# Patient Record
Sex: Male | Born: 2000 | Race: Black or African American | Hispanic: No | Marital: Single | Smoking: Never smoker
Health system: Southern US, Community
[De-identification: ages and names within clinical notes are randomized; demographics above are authoritative.]

## PROBLEM LIST (undated history)

## (undated) DIAGNOSIS — W3400XA Accidental discharge from unspecified firearms or gun, initial encounter: Secondary | ICD-10-CM

---
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
## 2019-07-30 ENCOUNTER — Ambulatory Visit: Payer: Self-pay | Admitting: Family Medicine

## 2019-07-30 ENCOUNTER — Other Ambulatory Visit: Payer: Self-pay

## 2019-07-30 ENCOUNTER — Encounter: Payer: Self-pay | Admitting: Family Medicine

## 2019-07-30 DIAGNOSIS — Z113 Encounter for screening for infections with a predominantly sexual mode of transmission: Secondary | ICD-10-CM

## 2019-07-30 DIAGNOSIS — Z202 Contact with and (suspected) exposure to infections with a predominantly sexual mode of transmission: Secondary | ICD-10-CM

## 2019-07-30 LAB — GRAM STAIN

## 2019-07-30 MED ORDER — AZITHROMYCIN 500 MG PO TABS
1000.0000 mg | ORAL_TABLET | Freq: Once | ORAL | Status: AC
  Administered 2019-07-30: 15:00:00 1000 mg via ORAL

## 2019-07-30 MED ORDER — AZITHROMYCIN 500 MG PO TABS: 500.0000 mg | ORAL_TABLET | Freq: Once | ORAL | Status: DC

## 2019-07-30 NOTE — Progress Notes (Signed)
Reviewed orders and Gram stain results.  Patient treated with Azithromycin 1 g po DOT today.  Reviewed with patient possible SE and when to call if vomits < 2 hr after taking medicine.

## 2019-07-30 NOTE — Progress Notes (Signed)
    STI clinic/screening visit  Subjective:  Joel Soto is a 18 y.o. male being seen today for an STI screening visit. The patient reports they do not have symptoms.  Patient has the following medical conditions:  There are no active problems to display for this patient.    Chief Complaint  Patient presents with  . Exposure to STD    HPI  Patient reports no sympts. here for treatment of chlamyida  See flowsheet for further details and programmatic requirements.    The following portions of the patient's history were reviewed and updated as appropriate: allergies, current medications, past medical history, past social history, past surgical history and problem list.  Objective:  There were no vitals filed for this visit.  Physical Exam HENT:     Mouth/Throat:     Pharynx: Oropharynx is clear.  Neck:     Musculoskeletal: No muscular tenderness.  Abdominal:     Palpations: Abdomen is soft.     Tenderness: There is no abdominal tenderness.  Genitourinary:    Penis: Normal.      Scrotum/Testes: Normal.  Lymphadenopathy:     Cervical: No cervical adenopathy.  Skin:    General: Skin is warm.     Findings: No lesion or rash.  Neurological:     Mental Status: He is alert.    Assessment and Plan:  Joel Soto is a 18 y.o. male presenting to the [facility] Department for STI screening  1. Screening examination for venereal disease  - Gram stain - Gonococcus culture - HIV [place] [facility] - Syphilis Serology, [facility]  2. Exposure to sexually transmitted disease (STD)  - Azithromycin (ZITHROMAX) tablet  1000 mg po once -Co. To contact partners for treatment and to use condoms for STD prevention     No follow-ups on file.  No future appointments.  Hassell Done, FNP

## 2019-07-30 NOTE — Progress Notes (Signed)
Patient here for STD testing. Partner diagnosed with Chlamydia, needs treatment for Chlamydia and desires all testing.Jenetta Downer, RN

## 2019-08-04 LAB — GONOCOCCUS CULTURE

## 2019-11-01 ENCOUNTER — Emergency Department: Payer: Medicaid Other

## 2019-11-01 ENCOUNTER — Emergency Department
Admission: EM | Admit: 2019-11-01 | Discharge: 2019-11-01 | Disposition: A | Discharge status: Home / Self Care | Payer: Medicaid Other | Attending: Emergency Medicine | Admitting: Emergency Medicine

## 2019-11-01 ENCOUNTER — Other Ambulatory Visit: Payer: Self-pay

## 2019-11-01 DIAGNOSIS — Z23 Encounter for immunization: Secondary | ICD-10-CM | POA: Insufficient documentation

## 2019-11-01 DIAGNOSIS — Y999 Unspecified external cause status: Secondary | ICD-10-CM | POA: Insufficient documentation

## 2019-11-01 DIAGNOSIS — W3400XA Accidental discharge from unspecified firearms or gun, initial encounter: Secondary | ICD-10-CM

## 2019-11-01 DIAGNOSIS — W3302XA Accidental discharge of hunting rifle, initial encounter: Secondary | ICD-10-CM | POA: Insufficient documentation

## 2019-11-01 DIAGNOSIS — Y9389 Activity, other specified: Secondary | ICD-10-CM | POA: Insufficient documentation

## 2019-11-01 DIAGNOSIS — Y929 Unspecified place or not applicable: Secondary | ICD-10-CM | POA: Insufficient documentation

## 2019-11-01 DIAGNOSIS — S91332A Puncture wound without foreign body, left foot, initial encounter: Secondary | ICD-10-CM | POA: Insufficient documentation

## 2019-11-01 HISTORY — DX: Accidental discharge from unspecified firearms or gun, initial encounter: W34.00XA

## 2019-11-01 LAB — BASIC METABOLIC PANEL
Anion gap: 11 (ref 5–15)
BUN: 13 mg/dL (ref 6–20)
CO2: 27 mmol/L (ref 22–32)
Calcium: 9.2 mg/dL (ref 8.9–10.3)
Chloride: 105 mmol/L (ref 98–111)
Creatinine, Ser: 1.07 mg/dL (ref 0.61–1.24)
GFR calc Af Amer: >60 mL/min (ref >60)
GFR calc non Af Amer: >60 mL/min (ref >60)
Glucose, Bld: 115 mg/dL — ABNORMAL HIGH (ref 70–99)
Potassium: 3.2 mmol/L — ABNORMAL LOW (ref 3.5–5.1)
Sodium: 143 mmol/L (ref 135–145)

## 2019-11-01 LAB — CBC
HCT: 45.8 % (ref 39.0–52.0)
Hemoglobin: 15.2 g/dL (ref 13.0–17.0)
MCH: 28.7 pg (ref 26.0–34.0)
MCHC: 33.2 g/dL (ref 30.0–36.0)
MCV: 86.6 fL (ref 80.0–100.0)
Platelets: 221 10*3/uL (ref 150–400)
RBC: 5.29 MIL/uL (ref 4.22–5.81)
RDW: 11.7 % (ref 11.5–15.5)
WBC: 13.1 10*3/uL — ABNORMAL HIGH (ref 4.0–10.5)
nRBC: 0 % (ref 0.0–0.2)

## 2019-11-01 MED ORDER — ONDANSETRON HCL 4 MG/2ML IJ SOLN
4.0000 mg | Freq: Once | INTRAMUSCULAR | Status: AC
  Administered 2019-11-01: 16:00:00 4 mg via INTRAVENOUS

## 2019-11-01 MED ORDER — MORPHINE SULFATE (PF) 4 MG/ML IV SOLN
4.0000 mg | Freq: Once | INTRAVENOUS | Status: AC
  Administered 2019-11-01: 16:00:00 4 mg via INTRAVENOUS

## 2019-11-01 MED ORDER — MORPHINE SULFATE (PF) 4 MG/ML IV SOLN
INTRAVENOUS | Status: AC
  Filled 2019-11-01: qty 1

## 2019-11-01 MED ORDER — OXYCODONE-ACETAMINOPHEN 5-325 MG PO TABS: 1.0000 | ORAL_TABLET | ORAL | 0 refills | Status: AC | PRN

## 2019-11-01 MED ORDER — TETANUS-DIPHTH-ACELL PERTUSSIS 5-2.5-18.5 LF-MCG/0.5 IM SUSP
0.5000 mL | Freq: Once | INTRAMUSCULAR | Status: AC
  Administered 2019-11-01: 18:00:00 0.5 mL via INTRAMUSCULAR
  Filled 2019-11-01: qty 0.5

## 2019-11-01 MED ORDER — ONDANSETRON HCL 4 MG/2ML IJ SOLN
INTRAMUSCULAR | Status: AC
  Filled 2019-11-01: qty 2
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
## 2020-07-29 ENCOUNTER — Other Ambulatory Visit: Payer: Self-pay

## 2020-07-29 ENCOUNTER — Ambulatory Visit: Payer: Medicaid Other | Admitting: Family Medicine

## 2020-07-29 ENCOUNTER — Encounter: Payer: Self-pay | Admitting: Physician Assistant

## 2020-07-29 ENCOUNTER — Ambulatory Visit: Payer: Medicaid Other

## 2020-07-29 DIAGNOSIS — Z113 Encounter for screening for infections with a predominantly sexual mode of transmission: Secondary | ICD-10-CM

## 2020-07-29 LAB — GRAM STAIN

## 2020-07-29 NOTE — Progress Notes (Signed)
° °  [facility] Department STI clinic/screening visit  Subjective:  Lucian Baswell is a 19 y.o. male being seen today for an STI screening visit. The patient reports they do not have symptoms.    Patient has the following medical conditions:  There are no problems to display for this patient.    Chief Complaint  Patient presents with   SEXUALLY TRANSMITTED DISEASE    screening     HPI  Patient reports here for screening and denies symptoms.     See flowsheet for further details and programmatic requirements.    The following portions of the patient's history were reviewed and updated as appropriate: allergies, current medications, past medical history, past social history, past surgical history and problem list.  Objective:  There were no vitals filed for this visit.  Physical Exam Constitutional:      Appearance: Normal appearance.  HENT:     Head: Normocephalic.     Mouth/Throat:     Mouth: Mucous membranes are moist.     Pharynx: Oropharynx is clear. No oropharyngeal exudate or posterior oropharyngeal erythema.  Genitourinary:    Comments: No lice, nits, or pest, no lesions or odor discharge.  Denies pain or tenderness with paplation of testicles.  No lesions, ulcers or masses present.  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   Musculoskeletal:     Cervical back: Normal range of motion.  Skin:    General: Skin is warm and dry.     Findings: No bruising, erythema, lesion or rash.  Neurological:     Mental Status: He is alert.  Psychiatric:        Mood and Affect: Mood normal.        Behavior: Behavior normal.       Assessment and Plan:  Darrio Bade is a 19 y.o. male presenting to the [facility] Department for STI screening  1. Screening examination for venereal disease - HIV [place] [facility] - Syphilis Serology, [place] [facility] - Gonococcus culture - Gram stain  Patient does not have STI  symptoms Patient accepted all screenings including  Gram stain, GC plate and bloodwork for HIV/RPR.  Patient meets criteria for HepB screening? No. Ordered? No - not at risk  Patient meets criteria for HepC screening? No. Ordered? No - not at risk  Recommended condom use with all sex Discussed importance of condom use for STI prevent  Treat gram stain per standing order Discussed time line for [facility] results and that patient will be called with positive results and encouraged patient to call if he had not heard in 2 weeks Recommended returning for continued or worsening symptoms. Discussed with patient importance of PCP to monitor hydrocele, PCP list given.     Patient verbalizes understanding.     No follow-ups on file.  No future appointments.  Wendi Snipes, RN

## 2020-08-03 LAB — GONOCOCCUS CULTURE

## 2020-08-08 NOTE — Progress Notes (Signed)
Reviewed ERRN documentation in flowsheet and exam note.  Agree that documentation meets STD program requirements/guidelines and standing orders.  Will sign since [facility] not cleared to sign documentation.

## 2021-04-30 ENCOUNTER — Ambulatory Visit: Payer: Medicaid Other

## 2021-04-30 ENCOUNTER — Other Ambulatory Visit: Payer: Self-pay

## 2021-05-04 ENCOUNTER — Other Ambulatory Visit: Payer: Self-pay

## 2021-05-04 ENCOUNTER — Ambulatory Visit: Payer: Self-pay | Admitting: Physician Assistant

## 2021-05-04 DIAGNOSIS — Z113 Encounter for screening for infections with a predominantly sexual mode of transmission: Secondary | ICD-10-CM

## 2021-05-04 LAB — GRAM STAIN

## 2021-05-05 ENCOUNTER — Encounter: Payer: Self-pay | Admitting: Physician Assistant

## 2021-05-05 NOTE — Progress Notes (Signed)
   [facility] Department STI clinic/screening visit  Subjective:  Joel Soto is a 20 y.o. male being seen today for an STI screening visit. The patient reports they do not have symptoms.    Patient has the following medical conditions:  There are no problems to display for this patient.    Chief Complaint  Patient presents with  . SEXUALLY TRANSMITTED DISEASE    screening    HPI  Patient reports that he is not having any symptoms but would like a screening today.  Denies chronic conditions and regular medicines.  States that his last HIV test was about 6 months ago, in 2021.  Last void prior to sample collection for Gram stain was over 2 hr ago.   See flowsheet for further details and programmatic requirements.    The following portions of the patient's history were reviewed and updated as appropriate: allergies, current medications, past medical history, past social history, past surgical history and problem list.  Objective:  There were no vitals filed for this visit.  Physical Exam Constitutional:      General: He is not in acute distress.    Appearance: Normal appearance.  HENT:     Head: Normocephalic and atraumatic.     Comments: No nits,lice, or hair loss. No cervical, supraclavicular or axillary adenopathy.    Mouth/Throat:     Mouth: Mucous membranes are moist.     Pharynx: Oropharynx is clear. No oropharyngeal exudate or posterior oropharyngeal erythema.  Eyes:     Conjunctiva/sclera: Conjunctivae normal.  Pulmonary:     Effort: Pulmonary effort is normal.  Abdominal:     Palpations: Abdomen is soft. There is no mass.     Tenderness: There is no abdominal tenderness. There is no guarding or rebound.  Genitourinary:    Penis: Normal.      Testes: Normal.     Comments: Pubic area without nits, lice, hair loss, edema, erythema, lesions and inguinal adenopathy. Penis circumcised without rash, lesions and discharge at meatus. Testicles  descended bilaterally,nt, no masses or edema.  Right side of scrotum with hydrocele. Musculoskeletal:     Cervical back: Neck supple. No tenderness.  Skin:    General: Skin is warm and dry.     Findings: No bruising, erythema, lesion or rash.  Neurological:     Mental Status: He is alert and oriented to person, place, and time.  Psychiatric:        Mood and Affect: Mood normal.        Behavior: Behavior normal.        Thought Content: Thought content normal.        Judgment: Judgment normal.       Assessment and Plan:  Joel Soto is a 20 y.o. male presenting to the [facility] Department for STI screening  1. Screening for STD (sexually transmitted disease) Patient into clinic without symptoms. Reviewed with patient that Gram stain is normal and no treatment is indicated today. Rec condoms with all sex. Await test results.  Counseled that RN will call if needs to RTC for treatment once results are back. - Gram stain - Gonococcus culture - HIV [place] [facility] - Syphilis Serology, [facility]     No follow-ups on file.  No future appointments.  Matt Holmes, PA

## 2021-05-06 LAB — HM HIV SCREENING LAB: HM HIV Screening: NEGATIVE

## 2021-05-08 LAB — GONOCOCCUS CULTURE
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
## 2021-07-22 ENCOUNTER — Ambulatory Visit: Payer: Medicaid Other

## 2021-07-26 ENCOUNTER — Ambulatory Visit: Payer: Medicaid Other | Admitting: Family Medicine

## 2021-07-26 ENCOUNTER — Other Ambulatory Visit: Payer: Self-pay

## 2021-07-26 ENCOUNTER — Encounter: Payer: Self-pay | Admitting: Family Medicine

## 2021-07-26 DIAGNOSIS — Z113 Encounter for screening for infections with a predominantly sexual mode of transmission: Secondary | ICD-10-CM

## 2021-07-26 LAB — GRAM STAIN

## 2021-07-26 NOTE — Progress Notes (Signed)
Patient here for STD screening. Gram stain reviewed. Providers orders completed.

## 2021-07-26 NOTE — Progress Notes (Signed)
[facility] Department STI clinic/screening visit  Subjective:  Joel Soto is a 20 y.o. male being seen today for an STI screening visit. The patient reports they do not have symptoms.    Patient has the following medical conditions:  There are no problems to display for this patient.    Chief Complaint  Patient presents with   SEXUALLY TRANSMITTED DISEASE    Screening     HPI  Patient reports here for screening, denies s/sx.    Does the patient or their partner desires a pregnancy in the next year? No  Screening for MPX risk: Does the patient have an unexplained rash? No Is the patient MSM? No Does the patient endorse multiple sex partners or anonymous sex partners? Yes Did the patient have close or sexual contact with a person diagnosed with MPX? No Has the patient traveled outside the [place] where MPX is endemic? No Is there a high clinical suspicion for MPX-- evidenced by one of the following No  -Unlikely to be chickenpox  -Lymphadenopathy  -Rash that present in same phase of evolution on any given body part   See flowsheet for further details and programmatic requirements.    The following portions of the patient's history were reviewed and updated as appropriate: allergies, current medications, past medical history, past social history, past surgical history and problem list.  Objective:  There were no vitals filed for this visit.  Physical Exam Constitutional:      Appearance: Normal appearance.  HENT:     Head: Normocephalic.     Mouth/Throat:     Mouth: Mucous membranes are moist.     Pharynx: Oropharynx is clear. No oropharyngeal exudate.  Pulmonary:     Effort: Pulmonary effort is normal.  Genitourinary:    Penis: Normal.      Testes: Normal.     Comments: No lice, nits, or pest, no lesions or odor discharge.  Denies pain or tenderness with paplation of testicles.  No lesions, ulcers or masses present.    Musculoskeletal:      Cervical back: Normal range of motion.  Lymphadenopathy:     Cervical: No cervical adenopathy.  Skin:    General: Skin is warm and dry.     Findings: No bruising, erythema, lesion or rash.  Neurological:     Mental Status: He is alert and oriented to person, place, and time.  Psychiatric:        Mood and Affect: Mood normal.        Behavior: Behavior normal.      Assessment and Plan:  Joel Soto is a 20 y.o. male presenting to the [facility] Department for STI screening  1. Screening examination for venereal disease Patient does not have STI symptoms Patient accepted all screenings including  gram stain, urethral GC and bloodwork for HIV/RPR.  Patient meets criteria for HepB screening? Yes. Ordered? No - declined  Patient meets criteria for HepC screening? Yes. Ordered? No - declined  Recommended condom use with all sex Discussed importance of condom use for STI prevent  Treat gram stain per standing order Discussed time line for State Lab results and that patient will be called with positive results and encouraged patient to call if he had not heard in 2 weeks Recommended returning for continued or worsening symptoms.    - HIV [place] [facility] - Syphilis Serology, [facility] - Gonococcus culture - Gram stain  No follow-ups on file.  No future appointments.  Wendi Snipes, FNP

## 2021-07-31 LAB — GONOCOCCUS CULTURE

## 2021-12-23 ENCOUNTER — Ambulatory Visit: Payer: Medicaid Other

## 2022-02-16 ENCOUNTER — Ambulatory Visit: Payer: Medicaid Other

## 2022-06-01 ENCOUNTER — Ambulatory Visit: Payer: Medicaid Other

## 2023-03-07 ENCOUNTER — Ambulatory Visit: Payer: Medicaid Other | Admitting: Family

## 2023-03-07 ENCOUNTER — Encounter: Payer: Self-pay | Admitting: Family

## 2023-03-07 DIAGNOSIS — Z113 Encounter for screening for infections with a predominantly sexual mode of transmission: Secondary | ICD-10-CM | POA: Diagnosis not present

## 2023-03-07 LAB — HM HIV SCREENING LAB: HM HIV Screening: NEGATIVE

## 2023-03-07 NOTE — Progress Notes (Unsigned)
[facility] Department STI clinic/screening visit  Subjective:  Joel Soto is a 22 y.o. male being seen today for an STI screening visit. The patient reports they do not have symptoms.    Patient has the following medical conditions:  There are no problems to display for this patient.    Chief Complaint  Patient presents with   SEXUALLY TRANSMITTED DISEASE    Routine screening, and new partner    HPI  Patient reports here for STI screening, has a new girlfriend and they are both getting tested. Denies symptoms or known exposure to STIs.  Last HIV test per patient/review of record was  Lab Results  Component Value Date   HMHIVSCREEN Negative - Validated 05/06/2021   No results found for: "HIV"  Does the patient or their partner desires a pregnancy in the next year? No  Screening for MPX risk: Does the patient have an unexplained rash? No Is the patient MSM? No Does the patient endorse multiple sex partners or anonymous sex partners? No Did the patient have close or sexual contact with a person diagnosed with MPX? No Has the patient traveled outside the [place] where MPX is endemic? No Is there a high clinical suspicion for MPX-- evidenced by one of the following No  -Unlikely to be chickenpox  -Lymphadenopathy  -Rash that present in same phase of evolution on any given body part   See flowsheet for further details and programmatic requirements.   Immunization History  Administered Date(s) Administered   HPV Quadrivalent 05/15/2013, 04/15/2015   Hepatitis A, Adult 05/09/2006, 01/10/2007   Hepatitis B, ADULT 1999/06[birth year]02, 08/26/1999, 01/06/2000   Tdap 08/19/2010, 11/01/2019     The following portions of the patient's history were reviewed and updated as appropriate: allergies, current medications, past medical history, past social history, past surgical history and problem list.  Objective:  There were no vitals filed for this visit.  Physical Exam Patient  declined Physical Exam   Assessment and Plan:  Joel Soto is a 22 y.o. male presenting to the [facility] Department for STI screening  1. Screening for venereal disease Will contact with positive results Use condoms for all sex  - Syphilis Serology, [facility] - HIV [place] [facility] - Chlamydia/GC NAA, Confirmation      Patient does not have STI symptoms Patient accepted all screenings including urine GC/Chlamydia, and blood work for HIV/Syphilis. Patient meets criteria for HepB screening? No. Ordered? no Patient meets criteria for HepC screening? No. Ordered? no Recommended condom use with all sex Discussed importance of condom use for STI prevent  Treat positive test results per standing order. Discussed time line for State Lab results and that patient will be called with positive results and encouraged patient to call if he had not heard in 2 weeks Recommended repeat testing in 3 months with positive results. Recommended returning for continued or worsening symptoms.   Return if symptoms worsen or fail to improve.  No future appointments.  Jerrell Belfast, FNP

## 2023-03-10 LAB — CHLAMYDIA/GC NAA, CONFIRMATION
Chlamydia trachomatis, NAA: NEGATIVE
Neisseria gonorrhoeae, NAA: NEGATIVE

## 2023-07-04 ENCOUNTER — Ambulatory Visit: Payer: Medicaid Other | Admitting: Podiatry

## 2024-01-01 ENCOUNTER — Emergency Department
Admission: EM | Admit: 2024-01-01 | Discharge: 2024-01-01 | Discharge status: Against Medical Advice | Payer: Medicaid Other | Source: Home / Self Care

## 2024-03-28 ENCOUNTER — Ambulatory Visit: Admitting: Nurse Practitioner

## 2024-03-28 ENCOUNTER — Encounter: Payer: Self-pay | Admitting: Nurse Practitioner

## 2024-03-28 DIAGNOSIS — Z113 Encounter for screening for infections with a predominantly sexual mode of transmission: Secondary | ICD-10-CM | POA: Diagnosis not present

## 2024-03-28 LAB — HM HIV SCREENING LAB: HM HIV Screening: NEGATIVE

## 2024-03-28 NOTE — Progress Notes (Signed)
 [facility] Department STI clinic [address] [place] [address] Main phone: [phone]  STI screening visit  Subjective:  Gray Affleck is a 23 y.o. male being seen today for an STI screening visit. The patient reports they do not have symptoms.    Patient has the following medical conditions:  There are no active problems to display for this patient.   Chief Complaint  Patient presents with   SEXUALLY TRANSMITTED DISEASE    Pt is here STD screening and has no symptoms    HPI HPI Patient reports 2 male partners in the last 2 months. He would like to be tested so that he and his primary partner can move forward with possibly becoming pregnant in the next year.  STI screening history: Last HIV test per patient/review of record was  Lab Results  Component Value Date   HMHIVSCREEN Negative - Validated 03/07/2023   No results found for: "HIV"  Last HEPC test per patient/review of record was No results found for: "HMHEPCSCREEN" No components found for: "HEPC"   Last HEPB test per patient/review of record was No components found for: "HMHEPBSCREEN"   Fertility: Does the patient or their partner desires a pregnancy in the next year? Yes  Screening for MPX risk: Does the patient have an unexplained rash? No Is the patient MSM? No Does the patient endorse multiple sex partners or anonymous sex partners? No Did the patient have close or sexual contact with a person diagnosed with MPX? No Has the patient traveled outside the [place]  where MPX is endemic? No Is there a high clinical suspicion for MPX-- evidenced by one of the following No  -Unlikely to be chickenpox  -Lymphadenopathy  -Rash that present in same phase of evolution on any given body part   See flowsheet for further details and programmatic requirements.   Immunization History  Administered Date(s) Administered   HPV Quadrivalent 05/15/2013, 04/15/2015   Hepatitis A,  Adult 05/09/2006, 01/10/2007   Hepatitis B, ADULT [birth year] 1999, 08/26/1999, 01/06/2000   Tdap 08/19/2010, 11/01/2019     The following portions of the patient's history were reviewed and updated as appropriate: allergies, current medications, past medical history, past social history, past surgical history and problem list.  Objective:  There were no vitals filed for this visit.  Physical Exam Vitals and nursing note reviewed.  Constitutional:      Appearance: Normal appearance.  HENT:     Head: Normocephalic and atraumatic.     Mouth/Throat:     Mouth: Mucous membranes are moist.     Pharynx: No oropharyngeal exudate or posterior oropharyngeal erythema.  Eyes:     General:        Right eye: No discharge.        Left eye: No discharge.     Conjunctiva/sclera:     Right eye: Right conjunctiva is not injected. No exudate.    Left eye: Left conjunctiva is not injected. No exudate. Pulmonary:     Effort: Pulmonary effort is normal.  Abdominal:     General: Abdomen is flat.     Palpations: Abdomen is soft. There is no hepatomegaly or mass.     Tenderness: There is no abdominal tenderness. There is no rebound.  Genitourinary:    Comments: Declined genital exam- asymptomatic Lymphadenopathy:     Cervical: No cervical adenopathy.     Upper Body:     Right upper body: No supraclavicular or axillary adenopathy.     Left  upper body: No supraclavicular or axillary adenopathy.  Skin:    General: Skin is warm and dry.  Neurological:     Mental Status: He is alert and oriented to person, place, and time.  Psychiatric:        Mood and Affect: Mood normal.        Behavior: Behavior normal.     Assessment and Plan:  Catherine Sama is a 23 y.o. male presenting to the [facility] Department for STI screening  1. Screening for venereal disease (Primary) Advised pt to have his partner begin taking PNV before conceiving.  - Syphilis Serology, [facility] - HIV [place]  [facility] - Chlamydia/GC NAA, Confirmation   Patient does not have STI symptoms Patient accepted all screenings including  urine GC/Chlamydia, and blood work for HIV/Syphilis. Patient meets criteria for HepB screening? No. Ordered? no Patient meets criteria for HepC screening? No. Ordered? no Recommended condom use with all sex Discussed importance of condom use for STI prevention  Treat positive test results per standing order. Discussed time line for State Lab results and that patient will be called with positive results and encouraged patient to call if he had not heard in 2 weeks Recommended repeat testing in 3 months with positive results. Recommended returning for continued or worsening symptoms.   Return if symptoms worsen or fail to improve.  No future appointments.  Hafsa Lohn K Renwick Asman, NP

## 2024-03-31 LAB — CHLAMYDIA/GC NAA, CONFIRMATION
Chlamydia trachomatis, NAA: NEGATIVE
Neisseria gonorrhoeae, NAA: NEGATIVE

## 2024-04-23 ENCOUNTER — Telehealth: Payer: Self-pay | Admitting: Family Medicine

## 2024-10-04 ENCOUNTER — Ambulatory Visit (LOCAL_COMMUNITY_HEALTH_CENTER): Payer: Self-pay

## 2024-10-04 DIAGNOSIS — Z111 Encounter for screening for respiratory tuberculosis: Secondary | ICD-10-CM

## 2024-10-07 ENCOUNTER — Ambulatory Visit (LOCAL_COMMUNITY_HEALTH_CENTER)

## 2024-10-07 DIAGNOSIS — Z111 Encounter for screening for respiratory tuberculosis: Secondary | ICD-10-CM

## 2024-10-07 DIAGNOSIS — R7611 Nonspecific reaction to tuberculin skin test without active tuberculosis: Secondary | ICD-10-CM

## 2024-10-07 LAB — TB SKIN TEST
Induration: 13 mm
TB Skin Test: POSITIVE

## 2024-10-07 LAB — HM HIV SCREENING LAB: HM HIV Screening: NEGATIVE

## 2024-10-07 NOTE — Progress Notes (Signed)
 In nurse clinic for PPDR. 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 J.Jensen came to do epi portion of visit. Pt signed ROI and walked to lab for HIV/Syphilis/Quantiferon Gold testing .Advised pt once received results would call.

## 2024-10-07 NOTE — Progress Notes (Signed)
 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  EPI completed today 10-07-2024. 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 Pt was advised that they are most likely not sick nor contagious with TB. Will follow up with QFT today to confirm result, as well as HIV/syphilis. If QFT positive, will proceed with chest x-ray.  Delon LITTIE Primrose, RN

## 2024-10-08 LAB — SYPHILIS: RPR W/REFLEX TO RPR TITER AND TREPONEMAL ANTIBODIES, TRADITIONAL SCREENING AND DIAGNOSIS ALGORITHM: RPR Ser Ql: NONREACTIVE

## 2024-10-09 NOTE — Addendum Note (Signed)
 Addended byBETHA ANDRIA GIVENS on: 10/09/2024 10:57 AM   Modules accepted: Orders

## 2024-10-10 ENCOUNTER — Ambulatory Visit: Payer: Self-pay

## 2024-10-10 LAB — QUANTIFERON-TB GOLD PLUS
QuantiFERON Mitogen Value: >10 [IU]/mL
QuantiFERON Nil Value: 0.03 [IU]/mL
QuantiFERON TB1 Ag Value: 0.03 [IU]/mL
QuantiFERON TB2 Ag Value: 0.03 [IU]/mL
QuantiFERON-TB Gold Plus: NEGATIVE

## 2024-11-04 NOTE — Addendum Note (Signed)
 Addended by: WONDA ROGUE on: 11/04/2024 12:54 PM   Modules accepted: Orders
